# Patient Record
Sex: Female | Born: 1965 | Race: White | Hispanic: No | Marital: Married | Smoking: Never smoker
Health system: Southern US, Community
[De-identification: ages and names within clinical notes are randomized; demographics above are authoritative.]

## PROBLEM LIST (undated history)

## (undated) DIAGNOSIS — E559 Vitamin D deficiency, unspecified: Secondary | ICD-10-CM

## (undated) DIAGNOSIS — E785 Hyperlipidemia, unspecified: Secondary | ICD-10-CM

## (undated) HISTORY — PX: NO PAST SURGERIES: SHX2092

## (undated) HISTORY — DX: Hyperlipidemia, unspecified: E78.5

## (undated) HISTORY — DX: Vitamin D deficiency, unspecified: E55.9

---

## 2017-08-29 DIAGNOSIS — G5762 Lesion of plantar nerve, left lower limb: Secondary | ICD-10-CM | POA: Insufficient documentation

## 2017-08-29 DIAGNOSIS — G5782 Other specified mononeuropathies of left lower limb: Secondary | ICD-10-CM | POA: Insufficient documentation

## 2017-08-29 DIAGNOSIS — M2042 Other hammer toe(s) (acquired), left foot: Secondary | ICD-10-CM | POA: Insufficient documentation

## 2018-08-06 DIAGNOSIS — Z1211 Encounter for screening for malignant neoplasm of colon: Secondary | ICD-10-CM | POA: Insufficient documentation

## 2021-02-21 ENCOUNTER — Encounter: Payer: Self-pay | Admitting: Cardiology

## 2021-03-02 ENCOUNTER — Telehealth: Payer: Self-pay

## 2021-03-02 ENCOUNTER — Encounter: Payer: Self-pay | Admitting: Cardiology

## 2021-03-02 ENCOUNTER — Other Ambulatory Visit: Payer: Self-pay

## 2021-03-02 ENCOUNTER — Ambulatory Visit: Payer: BC Managed Care – PPO | Admitting: Cardiology

## 2021-03-02 ENCOUNTER — Telehealth: Payer: Self-pay | Admitting: Cardiology

## 2021-03-02 VITALS — BP 104/66 | HR 79 | Ht 68.0 in | Wt 146.2 lb

## 2021-03-02 DIAGNOSIS — R0789 Other chest pain: Secondary | ICD-10-CM

## 2021-03-02 DIAGNOSIS — E785 Hyperlipidemia, unspecified: Secondary | ICD-10-CM | POA: Diagnosis not present

## 2021-03-02 DIAGNOSIS — R0602 Shortness of breath: Secondary | ICD-10-CM

## 2021-03-02 MED ORDER — IVABRADINE HCL 5 MG PO TABS: ORAL_TABLET | ORAL | 0 refills | Status: DC

## 2021-03-02 NOTE — Progress Notes (Signed)
Cardiology Office Note:    Date:  03/03/2021   ID:  Nyoka Lint, DOB 28-Oct-1966, MRN [id number]  PCP:  Paulina Fusi, MD  Cardiologist:  Thomasene Ripple, DO  Electrophysiologist:  None   Referring MD: Paulina Fusi, MD   I am having some intermittent chest discomfort  History of Present Illness:    Julia Wise is a 55 y.o. female with a hx of hyperlipidemia, family history of coronary artery disease comes today to be evaluated for chest tightness.  She tells me over the last several months she has had intermittent chest discomfort.  She describes it as a tightness sensation which can be midsternal but sometimes goes to the right side and radiates down her arm.  According to the patient this discomfort can last for few minutes or longer.  Nothing makes it better or worse.  In addition, she used to be very active but recently she is experiencing shortness of breath on exertion.  She tells that she is recently trying to go back to exercising and has not been able to tolerate this.  No palpitations, no lightheadedness, no dizziness.  Past Medical History:  Diagnosis Date  . Hyperlipidemia   . Vitamin D deficiency     Past Surgical History:  Procedure Laterality Date  . NO PAST SURGERIES      Current Medications: Current Meds  Medication Sig  . ivabradine (CORLANOR) 5 MG TABS tablet Take 2 hours before CT scan     Allergies:   Patient has no known allergies.   Social History   Socioeconomic History  . Marital status: Married    Spouse name: Not on file  . Number of children: Not on file  . Years of education: Not on file  . Highest education level: Not on file  Occupational History  . Not on file  Tobacco Use  . Smoking status: Never Smoker  . Smokeless tobacco: Not on file  Substance and Sexual Activity  . Alcohol use: Never  . Drug use: Never  . Sexual activity: Not on file  Other Topics Concern  . Not on file  Social History Narrative  . Not on  file   Social Determinants of Health   Financial Resource Strain: Not on file  Food Insecurity: Not on file  Transportation Needs: Not on file  Physical Activity: Not on file  Stress: Not on file  Social Connections: Not on file     Family History: The patient's family history includes Hyperlipidemia in her mother.  ROS:   Review of Systems  Constitution: Negative for decreased appetite, fever and weight gain.  HENT: Negative for congestion, ear discharge, hoarse voice and sore throat.   Eyes: Negative for discharge, redness, vision loss in right eye and visual halos.  Cardiovascular: Reports chest pain, dyspnea on exertion.  Negative for leg swelling, orthopnea and palpitations.  Respiratory: Negative for cough, hemoptysis, shortness of breath and snoring.   Endocrine: Negative for heat intolerance and polyphagia.  Hematologic/Lymphatic: Negative for bleeding problem. Does not bruise/bleed easily.  Skin: Negative for flushing, nail changes, rash and suspicious lesions.  Musculoskeletal: Negative for arthritis, joint pain, muscle cramps, myalgias, neck pain and stiffness.  Gastrointestinal: Negative for abdominal pain, bowel incontinence, diarrhea and excessive appetite.  Genitourinary: Negative for decreased libido, genital sores and incomplete emptying.  Neurological: Negative for brief paralysis, focal weakness, headaches and loss of balance.  Psychiatric/Behavioral: Negative for altered mental status, depression and suicidal ideas.  Allergic/Immunologic: Negative for  HIV exposure and persistent infections.    EKGs/Labs/Other Studies Reviewed:    The following studies were reviewed today:  EKG:  The ekg ordered today demonstrates sinus rhythm, heart rate 79 bpm with nonspecific ST changes.  Recent Labs: No results found for requested labs within last 8760 hours.  Recent Lipid Panel No results found for: CHOL, TRIG, HDL, CHOLHDL, VLDL, LDLCALC, LDLDIRECT  Physical Exam:     VS:  BP 104/66 (BP Location: Right Arm)   Pulse 79   Ht 5\' 8"  (1.727 m)   Wt 146 lb 3.2 oz (66.3 kg)   SpO2 99%   BMI 22.23 kg/m     Wt Readings from Last 3 Encounters:  03/02/21 146 lb 3.2 oz (66.3 kg)  02/17/21 147 lb (66.7 kg)     GEN: Well nourished, well developed in no acute distress HEENT: Normal NECK: No JVD; No carotid bruits LYMPHATICS: No lymphadenopathy CARDIAC: S1S2 noted,RRR, no murmurs, rubs, gallops RESPIRATORY:  Clear to auscultation without rales, wheezing or rhonchi  ABDOMEN: Soft, non-tender, non-distended, +bowel sounds, no guarding. EXTREMITIES: No edema, No cyanosis, no clubbing MUSCULOSKELETAL:  No deformity  SKIN: Warm and dry NEUROLOGIC:  Alert and oriented x 3, non-focal PSYCHIATRIC:  Normal affect, good insight  ASSESSMENT:    1. SOB (shortness of breath)   2. Other chest pain   3. Hyperlipidemia, unspecified hyperlipidemia type    PLAN:    The symptoms of chest pain is concerning, this patient does have intermediate risk for coronary artery disease and at this time I would like to pursue an ischemic evaluation in this patient.  Shared decision a coronary CTA at this time is appropriate.  I have discussed with the patient about the testing.  The patient has no IV contrast allergy and is agreeable to proceed with this test.  In addition for her shortness of breath, echocardiogram will also be done to assess LV/RV function and any other structure abnormalities.  She is on Crestor 10 mg daily we will continue this for now.  The patient is in agreement with the above plan. The patient left the office in stable condition.  The patient will follow up in   Medication Adjustments/Labs and Tests Ordered: Current medicines are reviewed at length with the patient today.  Concerns regarding medicines are outlined above.  Orders Placed This Encounter  Procedures  . CT CORONARY MORPH W/CTA COR W/SCORE W/CA W/CM &/OR WO/CM  . Basic metabolic panel  .  Magnesium  . EKG 12-Lead  . ECHOCARDIOGRAM COMPLETE   Meds ordered this encounter  Medications  . ivabradine (CORLANOR) 5 MG TABS tablet    Sig: Take 2 hours before CT scan    Dispense:  1 tablet    Refill:  0    Patient Instructions   Medication Instructions:  Your physician recommends that you continue on your current medications as directed. Please refer to the Current Medication list given to you today.  *If you need a refill on your cardiac medications before your next appointment, please call your pharmacy*   Lab Work: Your physician recommends that you return for lab work: 3-7 days before CT: BMET, Mag If you have labs (blood work) drawn today and your tests are completely normal, you will receive your results only by: 02/19/21 MyChart Message (if you have MyChart) OR . A paper copy in the mail If you have any lab test that is abnormal or we need to change your treatment, we will call you to  review the results.   Testing/Procedures: Your physician has requested that you have an echocardiogram. Echocardiography is a painless test that uses sound waves to create images of your heart. It provides your doctor with information about the size and shape of your heart and how well your heart's chambers and valves are working. This procedure takes approximately one hour. There are no restrictions for this procedure.  Your cardiac CT will be scheduled at:   [facility] [address] [place], [place] [address] [phone]  If scheduled at [facility], please arrive at the [facility] main entrance (entrance A) of [facility] 30 minutes prior to test start time. Proceed to the [facility] Radiology Department (first floor) to check-in and test prep.   Please follow these instructions carefully (unless otherwise directed):  On the Night Before the Test: . Be sure to Drink plenty of water. . Do not consume any caffeinated/decaffeinated beverages or  chocolate 12 hours prior to your test. . Do not take any antihistamines 12 hours prior to your test.  On the Day of the Test: . Drink plenty of water until 1 hour prior to the test. . Do not eat any food 4 hours prior to the test. . You may take your regular medications prior to the test.  . Take  Ivabradine (Corlanor) two hours prior to test. . HOLD Furosemide/Hydrochlorothiazide morning of the test. . FEMALES- please wear underwire-free bra if available       After the Test: . Drink plenty of water. . After receiving IV contrast, you may experience a mild flushed feeling. This is normal. . On occasion, you may experience a mild rash up to 24 hours after the test. This is not dangerous. If this occurs, you can take Benadryl 25 mg and increase your fluid intake. . If you experience trouble breathing, this can be serious. If it is severe call 911 IMMEDIATELY. If it is mild, please call our office. . If you take any of these medications: Glipizide/Metformin, Avandament, Glucavance, please do not take 48 hours after completing test unless otherwise instructed.   Once we have confirmed authorization from your insurance company, we will call you to set up a date and time for your test. Based on how quickly your insurance processes prior authorizations requests, please allow up to 4 weeks to be contacted for scheduling your Cardiac CT appointment. Be advised that routine Cardiac CT appointments could be scheduled as many as 8 weeks after your provider has ordered it.  For non-scheduling related questions, please contact the cardiac imaging nurse navigator should you have any questions/concerns: Rockwell Alexandria, Cardiac Imaging Nurse Navigator Larey Brick, Cardiac Imaging Nurse Navigator [facility] Direct Office Dial: [phone]   For scheduling needs, including cancellations and rescheduling, please call [place], [phone].     Follow-Up: At [facility], you and your health needs are our priority.  As part of our continuing mission to provide you with exceptional heart care, we have created designated Provider Care Teams.  These Care Teams include your primary Cardiologist (physician) and Advanced Practice Providers (APPs -  Physician Assistants and Nurse Practitioners) who all work together to provide you with the care you need, when you need it.  We recommend signing up for the patient portal called "MyChart".  Sign up information is provided on this After Visit Summary.  MyChart is used to connect with patients for Virtual Visits (Telemedicine).  Patients are able to  view lab/test results, encounter notes, upcoming appointments, etc.  Non-urgent messages can be sent to your provider as well.   To learn more about what you can do with MyChart, go to [email or web address].    Your next appointment:   As needed  The format for your next appointment:   In Person  Provider:   Thomasene Ripple, DO   Other Instructions  Echocardiogram An echocardiogram is a test that uses sound waves (ultrasound) to produce images of the heart. Images from an echocardiogram can provide important information about:  Heart size and shape.  The size and thickness and movement of your heart's walls.  Heart muscle function and strength.  Heart valve function or if you have stenosis. Stenosis is when the heart valves are too narrow.  If blood is flowing backward through the heart valves (regurgitation).  A tumor or infectious growth around the heart valves.  Areas of heart muscle that are not working well because of poor blood flow or injury from a heart attack.  Aneurysm detection. An aneurysm is a weak or damaged part of an artery wall. The wall bulges out from the normal force of blood pumping through the body. Tell a health care provider about:  Any allergies you have.  All medicines you are taking, including vitamins, herbs, eye drops,  creams, and over-the-counter medicines.  Any blood disorders you have.  Any surgeries you have had.  Any medical conditions you have.  Whether you are pregnant or may be pregnant. What are the risks? Generally, this is a safe test. However, problems may occur, including an allergic reaction to dye (contrast) that may be used during the test. What happens before the test? No specific preparation is needed. You may eat and drink normally. What happens during the test?  You will take off your clothes from the waist up and put on a hospital gown.  Electrodes or electrocardiogram (ECG)patches may be placed on your chest. The electrodes or patches are then connected to a device that monitors your heart rate and rhythm.  You will lie down on a table for an ultrasound exam. A gel will be applied to your chest to help sound waves pass through your skin.  A handheld device, called a transducer, will be pressed against your chest and moved over your heart. The transducer produces sound waves that travel to your heart and bounce back (or "echo" back) to the transducer. These sound waves will be captured in real-time and changed into images of your heart that can be viewed on a video monitor. The images will be recorded on a computer and reviewed by your health care provider.  You may be asked to change positions or hold your breath for a short time. This makes it easier to get different views or better views of your heart.  In some cases, you may receive contrast through an IV in one of your veins. This can improve the quality of the pictures from your heart. The procedure may vary among health care providers and hospitals.   What can I expect after the test? You may return to your normal, everyday life, including diet, activities, and medicines, unless your health care provider tells you not to do that. Follow these instructions at home:  It is up to you to get the results of your test. Ask your  health care provider, or the department that is doing the test, when your results will be ready.  Keep all follow-up visits. This is  important. Summary  An echocardiogram is a test that uses sound waves (ultrasound) to produce images of the heart.  Images from an echocardiogram can provide important information about the size and shape of your heart, heart muscle function, heart valve function, and other possible heart problems.  You do not need to do anything to prepare before this test. You may eat and drink normally.  After the echocardiogram is completed, you may return to your normal, everyday life, unless your health care provider tells you not to do that. This information is not intended to replace advice given to you by your health care provider. Make sure you discuss any questions you have with your health care provider. Document Revised: 06/07/2020 Document Reviewed: 06/07/2020 Elsevier Patient Education  2021 Elsevier Inc.     Adopting a Healthy Lifestyle.  Know what a healthy weight is for you (roughly BMI <25) and aim to maintain this   Aim for 7+ servings of fruits and vegetables daily   65-80+ fluid ounces of water or unsweet tea for healthy kidneys   Limit to max 1 drink of alcohol per day; avoid smoking/tobacco   Limit animal fats in diet for cholesterol and heart health - choose grass fed whenever available   Avoid highly processed foods, and foods high in saturated/trans fats   Aim for low stress - take time to unwind and care for your mental health   Aim for 150 min of moderate intensity exercise weekly for heart health, and weights twice weekly for bone health   Aim for 7-9 hours of sleep daily   When it comes to diets, agreement about the perfect plan isnt easy to find, even among the experts. Experts at the [employer] developed an idea known as the Healthy Eating Plate. Just imagine a plate divided into logical, healthy portions.    The emphasis is on diet quality:   Load up on vegetables and fruits - one-half of your plate: Aim for color and variety, and remember that potatoes dont count.   Go for whole grains - one-quarter of your plate: Whole wheat, barley, wheat berries, quinoa, oats, brown rice, and foods made with them. If you want pasta, go with whole wheat pasta.   Protein power - one-quarter of your plate: Fish, chicken, beans, and nuts are all healthy, versatile protein sources. Limit red meat.   The diet, however, does go beyond the plate, offering a few other suggestions.   Use healthy plant oils, such as olive, canola, soy, corn, sunflower and peanut. Check the labels, and avoid partially hydrogenated oil, which have unhealthy trans fats.   If youre thirsty, drink water. Coffee and tea are good in moderation, but skip sugary drinks and limit milk and dairy products to one or two daily servings.   The type of carbohydrate in the diet is more important than the amount. Some sources of carbohydrates, such as vegetables, fruits, whole grains, and beans-are healthier than others.   Finally, stay active  Signed, Thomasene RippleKardie Ciela Mahajan, DO  03/03/2021 8:00 PM    [facility]

## 2021-03-02 NOTE — Telephone Encounter (Signed)
pharmacy is calling to get clarification on a patient prescription. Please advise

## 2021-03-02 NOTE — Patient Instructions (Addendum)
Medication Instructions:  Your physician recommends that you continue on your current medications as directed. Please refer to the Current Medication list given to you today.  *If you need a refill on your cardiac medications before your next appointment, please call your pharmacy*   Lab Work: Your physician recommends that you return for lab work: 3-7 days before CT: BMET, Mag If you have labs (blood work) drawn today and your tests are completely normal, you will receive your results only by: Marland Kitchen MyChart Message (if you have MyChart) OR . A paper copy in the mail If you have any lab test that is abnormal or we need to change your treatment, we will call you to review the results.   Testing/Procedures: Your physician has requested that you have an echocardiogram. Echocardiography is a painless test that uses sound waves to create images of your heart. It provides your doctor with information about the size and shape of your heart and how well your heart's chambers and valves are working. This procedure takes approximately one hour. There are no restrictions for this procedure.  Your cardiac CT will be scheduled at:   [facility] [address] [place], [place] [address] [phone]  If scheduled at [facility], please arrive at the [facility] main entrance (entrance A) of [facility] 30 minutes prior to test start time. Proceed to the [facility] Radiology Department (first floor) to check-in and test prep.   Please follow these instructions carefully (unless otherwise directed):  On the Night Before the Test: . Be sure to Drink plenty of water. . Do not consume any caffeinated/decaffeinated beverages or chocolate 12 hours prior to your test. . Do not take any antihistamines 12 hours prior to your test.  On the Day of the Test: . Drink plenty of water until 1 hour prior to the test. . Do not eat any food 4 hours prior to the test. . You may take  your regular medications prior to the test.  . Take  Ivabradine (Corlanor) two hours prior to test. . HOLD Furosemide/Hydrochlorothiazide morning of the test. . FEMALES- please wear underwire-free bra if available       After the Test: . Drink plenty of water. . After receiving IV contrast, you may experience a mild flushed feeling. This is normal. . On occasion, you may experience a mild rash up to 24 hours after the test. This is not dangerous. If this occurs, you can take Benadryl 25 mg and increase your fluid intake. . If you experience trouble breathing, this can be serious. If it is severe call 911 IMMEDIATELY. If it is mild, please call our office. . If you take any of these medications: Glipizide/Metformin, Avandament, Glucavance, please do not take 48 hours after completing test unless otherwise instructed.   Once we have confirmed authorization from your insurance company, we will call you to set up a date and time for your test. Based on how quickly your insurance processes prior authorizations requests, please allow up to 4 weeks to be contacted for scheduling your Cardiac CT appointment. Be advised that routine Cardiac CT appointments could be scheduled as many as 8 weeks after your provider has ordered it.  For non-scheduling related questions, please contact the cardiac imaging nurse navigator should you have any questions/concerns: Rockwell Alexandria, Cardiac Imaging Nurse Navigator Larey Brick, Cardiac Imaging Nurse Navigator [facility] Direct Office Dial: [phone]   For scheduling needs, including cancellations  and rescheduling, please call [place], [phone].     Follow-Up: At [facility], you and your health needs are our priority.  As part of our continuing mission to provide you with exceptional heart care, we have created designated Provider Care Teams.  These Care Teams include your primary Cardiologist (physician) and Advanced  Practice Providers (APPs -  Physician Assistants and Nurse Practitioners) who all work together to provide you with the care you need, when you need it.  We recommend signing up for the patient portal called "MyChart".  Sign up information is provided on this After Visit Summary.  MyChart is used to connect with patients for Virtual Visits (Telemedicine).  Patients are able to view lab/test results, encounter notes, upcoming appointments, etc.  Non-urgent messages can be sent to your provider as well.   To learn more about what you can do with MyChart, go to [email or web address].    Your next appointment:   As needed  The format for your next appointment:   In Person  Provider:   Thomasene Ripple, DO   Other Instructions  Echocardiogram An echocardiogram is a test that uses sound waves (ultrasound) to produce images of the heart. Images from an echocardiogram can provide important information about:  Heart size and shape.  The size and thickness and movement of your heart's walls.  Heart muscle function and strength.  Heart valve function or if you have stenosis. Stenosis is when the heart valves are too narrow.  If blood is flowing backward through the heart valves (regurgitation).  A tumor or infectious growth around the heart valves.  Areas of heart muscle that are not working well because of poor blood flow or injury from a heart attack.  Aneurysm detection. An aneurysm is a weak or damaged part of an artery wall. The wall bulges out from the normal force of blood pumping through the body. Tell a health care provider about:  Any allergies you have.  All medicines you are taking, including vitamins, herbs, eye drops, creams, and over-the-counter medicines.  Any blood disorders you have.  Any surgeries you have had.  Any medical conditions you have.  Whether you are pregnant or may be pregnant. What are the risks? Generally, this is a safe test. However, problems may  occur, including an allergic reaction to dye (contrast) that may be used during the test. What happens before the test? No specific preparation is needed. You may eat and drink normally. What happens during the test?  You will take off your clothes from the waist up and put on a hospital gown.  Electrodes or electrocardiogram (ECG)patches may be placed on your chest. The electrodes or patches are then connected to a device that monitors your heart rate and rhythm.  You will lie down on a table for an ultrasound exam. A gel will be applied to your chest to help sound waves pass through your skin.  A handheld device, called a transducer, will be pressed against your chest and moved over your heart. The transducer produces sound waves that travel to your heart and bounce back (or "echo" back) to the transducer. These sound waves will be captured in real-time and changed into images of your heart that can be viewed on a video monitor. The images will be recorded on a computer and reviewed by your health care provider.  You may be asked to change positions or hold your breath for a short time. This makes it easier to get different views or  better views of your heart.  In some cases, you may receive contrast through an IV in one of your veins. This can improve the quality of the pictures from your heart. The procedure may vary among health care providers and hospitals.   What can I expect after the test? You may return to your normal, everyday life, including diet, activities, and medicines, unless your health care provider tells you not to do that. Follow these instructions at home:  It is up to you to get the results of your test. Ask your health care provider, or the department that is doing the test, when your results will be ready.  Keep all follow-up visits. This is important. Summary  An echocardiogram is a test that uses sound waves (ultrasound) to produce images of the heart.  Images  from an echocardiogram can provide important information about the size and shape of your heart, heart muscle function, heart valve function, and other possible heart problems.  You do not need to do anything to prepare before this test. You may eat and drink normally.  After the echocardiogram is completed, you may return to your normal, everyday life, unless your health care provider tells you not to do that. This information is not intended to replace advice given to you by your health care provider. Make sure you discuss any questions you have with your health care provider. Document Revised: 06/07/2020 Document Reviewed: 06/07/2020 Elsevier Patient Education  2021 [employer].

## 2021-03-02 NOTE — Telephone Encounter (Signed)
PA started on [facility] for Corlanor. Key  [id number]

## 2021-03-03 ENCOUNTER — Telehealth: Payer: Self-pay | Admitting: Cardiology

## 2021-03-03 NOTE — Telephone Encounter (Signed)
A script was written for 1 tablet but insurance doesn't cover it and the pt would have to be charged for a bottle of 60. Pharmacy wants to know if there is a alternative med pt can use. ivabradine (CORLANOR) 5 MG TABS tablet

## 2021-03-06 MED ORDER — IVABRADINE HCL 5 MG PO TABS: ORAL_TABLET | ORAL | 0 refills | Status: AC

## 2021-03-06 NOTE — Telephone Encounter (Signed)
Called patient. She would like to pick up from [facility] with discount. Gave her numbers for discount. Sent RX for [facility]radine to [facility] as patient requested.

## 2021-03-06 NOTE — Telephone Encounter (Signed)
Please gave lopressor 50mg  x1 dose

## 2021-03-06 NOTE — Telephone Encounter (Signed)
Could you please give lopressor 50 mg

## 2021-03-06 NOTE — Telephone Encounter (Signed)
Can you please help with this matter? Thanks

## 2021-03-06 NOTE — Telephone Encounter (Signed)
Called and spoke with Julia Wise at [facility]. She states they can not just give one Ivabradine. They come in a bottle of 60 and they can not charge the patient for 60 and just have her throw the other 59 away. She wants to know is there an alternative to the medication or can we give the patient the medication. Will relay to Dr. Servando Salina for recommendation.

## 2021-03-06 NOTE — Telephone Encounter (Signed)
Single tablet of Corlanor would be ~$11 at a [employer] card. May be more preferable for pt than picking up a sample since she does not live in [place]. Would check if she's ok with that cost and picking up a dose at [facility]. If so, pharmacy will need below info to process discounted rate:  BIN [id number] [id number] [id number] Member ID [id number]

## 2021-03-06 NOTE — Telephone Encounter (Signed)
Delice Bison with [facility] is following up.  Are any samples available at [facility]? Patient just needs 1 tablet prior to her CT.  Patient calling the office for samples of medication:   1.  What medication and dosage are you requesting samples for? ivabradine (CORLANOR) 5 MG TABS tablet  2.  Are you currently out of this medication?

## 2021-03-07 ENCOUNTER — Other Ambulatory Visit: Payer: Self-pay

## 2021-03-07 MED ORDER — METOPROLOL TARTRATE 50 MG PO TABS: ORAL_TABLET | ORAL | 0 refills | Status: AC

## 2021-03-07 NOTE — Telephone Encounter (Signed)
Prescription sent to pharmacy.

## 2021-03-07 NOTE — Progress Notes (Signed)
Prescription sent to pharmacy.

## 2021-03-07 NOTE — Telephone Encounter (Signed)
Spoke with patient about making the other medication change. She states the original prescription was sent into [facility] and she was told to use a [facility] card and they would be able to fill the one Ivabradine.

## 2021-03-13 NOTE — Telephone Encounter (Signed)
Denied on CMM. You do not meet the requirements of your plan. Your plan covers this drug when you meet all of these conditions: - You are an adult - You have symptomatic heart failure - You are using it to reduce your risk of being hospitalized for worsening heart failure 

## 2021-03-23 ENCOUNTER — Ambulatory Visit (INDEPENDENT_AMBULATORY_CARE_PROVIDER_SITE_OTHER): Payer: BC Managed Care – PPO

## 2021-03-23 ENCOUNTER — Other Ambulatory Visit: Payer: Self-pay

## 2021-03-23 DIAGNOSIS — R0602 Shortness of breath: Secondary | ICD-10-CM

## 2021-03-23 LAB — ECHOCARDIOGRAM COMPLETE
Area-P 1/2: 3.97 cm2
S' Lateral: 2.3 cm

## 2021-03-23 NOTE — Progress Notes (Signed)
Complete echocardiogram performed.  Jimmy Yasemin Rabon RDCS, RVT  

## 2021-03-29 ENCOUNTER — Telehealth (HOSPITAL_COMMUNITY): Payer: Self-pay | Admitting: *Deleted

## 2021-03-29 NOTE — Telephone Encounter (Signed)
Reaching out to patient to offer assistance regarding upcoming cardiac imaging study; pt verbalizes understanding of appt date/time, parking situation and where to check in, pre-test NPO status and medications ordered, and verified current allergies; name and call back number provided for further questions should they arise  Deon Ivey RN Navigator Cardiac Imaging [facility] [phone] office [phone] cell  Pt to take 50mg metoprolol tartrate 2 hours prior to cardiac CT scan. 

## 2021-03-30 ENCOUNTER — Other Ambulatory Visit: Payer: Self-pay

## 2021-03-30 ENCOUNTER — Ambulatory Visit (HOSPITAL_COMMUNITY)
Admission: RE | Admit: 2021-03-30 | Discharge: 2021-03-30 | Disposition: A | Discharge status: Home / Self Care | Payer: BC Managed Care – PPO | Source: Ambulatory Visit | Attending: Cardiology | Admitting: Cardiology

## 2021-03-30 ENCOUNTER — Other Ambulatory Visit (HOSPITAL_COMMUNITY): Payer: Self-pay | Admitting: Emergency Medicine

## 2021-03-30 DIAGNOSIS — R0789 Other chest pain: Secondary | ICD-10-CM | POA: Insufficient documentation

## 2021-03-30 DIAGNOSIS — R931 Abnormal findings on diagnostic imaging of heart and coronary circulation: Secondary | ICD-10-CM

## 2021-03-30 MED ORDER — SODIUM CHLORIDE 0.9 % IV BOLUS
500.0000 mL | Freq: Once | INTRAVENOUS | Status: AC
  Administered 2021-03-30: 500 mL via INTRAVENOUS

## 2021-03-30 MED ORDER — NITROGLYCERIN 0.4 MG SL SUBL
SUBLINGUAL_TABLET | SUBLINGUAL | Status: AC
  Filled 2021-03-30: qty 1

## 2021-03-30 MED ORDER — IOHEXOL 350 MG/ML SOLN
95.0000 mL | Freq: Once | INTRAVENOUS | Status: AC | PRN
  Administered 2021-03-30: 95 mL via INTRAVENOUS

## 2021-03-30 MED ORDER — NITROGLYCERIN 0.4 MG SL SUBL
0.4000 mg | SUBLINGUAL_TABLET | Freq: Once | SUBLINGUAL | Status: AC
  Administered 2021-03-30: 0.4 mg via SUBLINGUAL

## 2023-01-27 IMAGING — CT CT HEART MORP W/ CTA COR W/ SCORE W/ CA W/CM &/OR W/O CM
1 series · 3 of 3 positions shown, 4 images · IV contrast (omnipaque)
Comparison: None.
COMPARISON: None.

Addendum:
EXAM:
OVER-READ INTERPRETATION  CT CHEST

The following report is an over-read performed by radiologist Dr.
Hj Ahad Shuen [REDACTED] on 03/30/2021. This
over-read does not include interpretation of cardiac or coronary
anatomy or pathology. The coronary calcium score/coronary CTA
interpretation by the cardiologist is attached.
HISTORY: 55 yo female wtih chest pain, nonspecific
Cardiac/Coronary CTA
TECHNIQUE: The patient was scanned on a Siemens Force scanner.
PROTOCOL: A 70 kV prospective scan was triggered in the descending thoracic
aorta at 111 HU's. High pitch axial non-contrast 6 mm slices were
carried out through the heart. The data set was analyzed on a
dedicated work station and scored using the Agatson method. Gantry
rotation speed was 250 msecs and collimation was .6 mm. Beta
blockade and 0.8 mg of sl NTG was given. The 3D data set was
reconstructed in at 87% of the R-R cycle. Diastolic phases were
analyzed on a dedicated work station using MPR, MIP and VRT modes.
The patient received 95mL OMNIPAQUE IOHEXOL 350 MG/ML SOLN of
contrast. Total radiation exposure was 0.7 mSv.

[Series 1129: coronary arteries · 3 of 3 slices shown, 4 images]
[im 1/3  vessel]
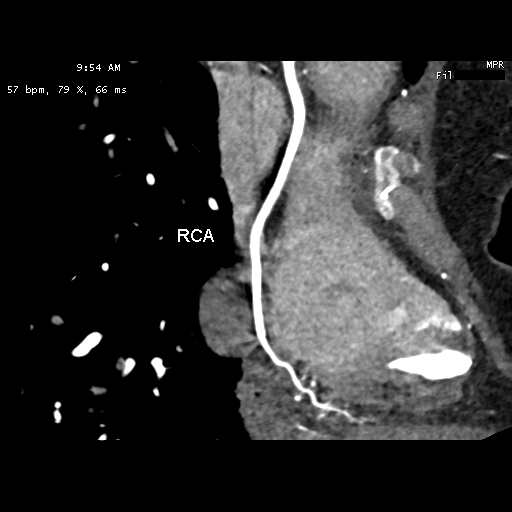
[im 1/3  lung]
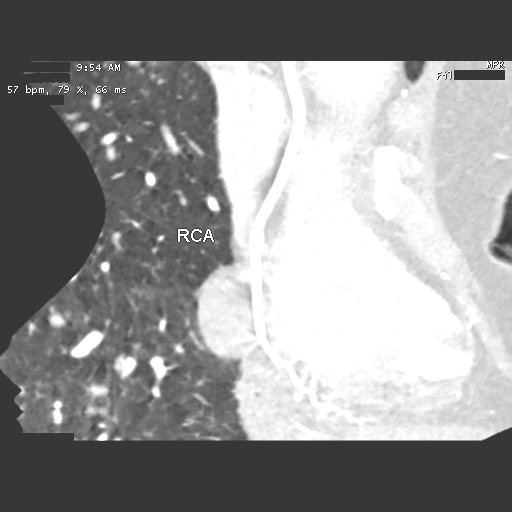
[im 2/3  vessel]
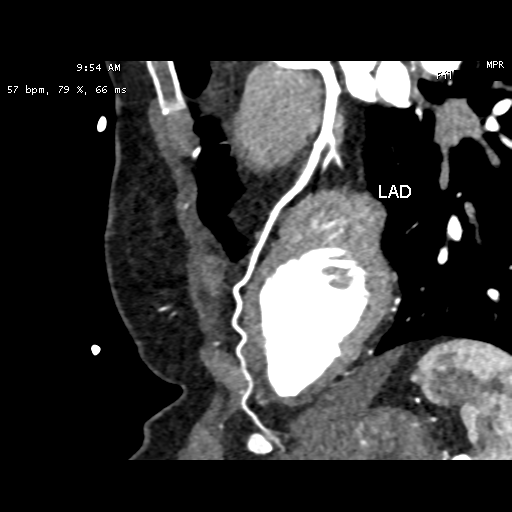
[im 3/3  vessel]
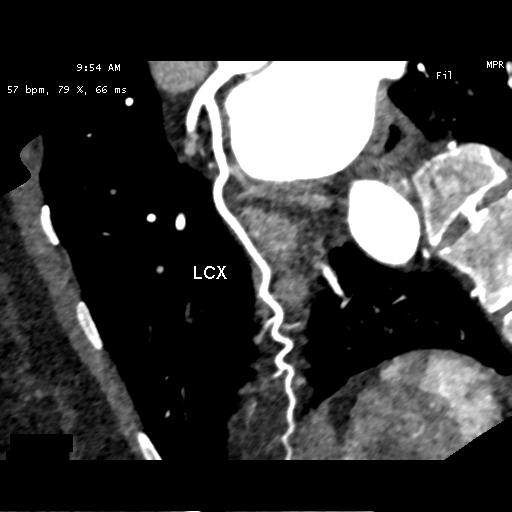

[3 of 3 positions shown; findings below may reference images not displayed]

FINDINGS: There is some clustered micronodularity in the periphery of the
right upper lobe with several tiny 2 mm pulmonary nodules and an
incompletely imaged ground-glass attenuation nodule measuring 9 mm
(axial image 1 of series 9). Within the visualized portions of the
thorax there are no other larger more suspicious appearing pulmonary
nodules or masses, there is no acute consolidative airspace disease,
no pleural effusions, no pneumothorax and no lymphadenopathy.
Visualized portions of the upper abdomen are unremarkable. There are
no aggressive appearing lytic or blastic lesions noted in the
visualized portions of the skeleton.
IMPRESSION: 1. Ground-glass attenuation nodule and clustered micro nodularity in
the periphery of the right upper lobe. Initial follow-up with CT at
6-12 months is recommended to confirm persistence. If persistent,
repeat CT is recommended every 2 years until 5 years of stability
has been established. This recommendation follows the consensus
statement: Guidelines for Management of Incidental Pulmonary Nodules
Detected on CT Images: From the [HOSPITAL] 0377; Radiology
FINDINGS: Quality: Very good, HR 57

Coronary calcium score: The patient's coronary artery calcium score
is 0, which places the patient in the 0 percentile.

Coronary arteries: Normal coronary origins.  Right dominance.

Right Coronary Artery: Normal.  Dominant vessel.

Left Main Coronary Artery: Normal. Bifurcates into the LAD and LCx
arteries.

Left Anterior Descending Coronary Artery: Normal large anterior
vessel that wraps around the apex. Several diagonal branches without
disease.

Left Circumflex Artery: Smaller caliber LCx vessel without disease.
The distal vessel is tortuous.

Aorta: Normal size, 24 mm at the mid ascending aorta (level of the
PA bifurcation) measured double oblique. No calcifications. No
dissection.

Aortic Valve: Trileaflet.  No calcifications.

Other findings:

Normal pulmonary vein drainage into the left atrium.

Normal left atrial appendage without a thrombus.

Normal size of the pulmonary artery.
IMPRESSION: 1. No evidence of CAD, CADRADS = 0.

2. Coronary calcium score of 0. This was 0 percentile for age and
sex matched control.

3. Normal coronary origin with right dominance.

4. Consider non-cardiac causes of chest pain.

*** End of Addendum ***
EXAM:
OVER-READ INTERPRETATION  CT CHEST

The following report is an over-read performed by radiologist Dr.
Hj Ahad Shuen [REDACTED] on 03/30/2021. This
over-read does not include interpretation of cardiac or coronary
anatomy or pathology. The coronary calcium score/coronary CTA
interpretation by the cardiologist is attached.
FINDINGS: There is some clustered micronodularity in the periphery of the
right upper lobe with several tiny 2 mm pulmonary nodules and an
incompletely imaged ground-glass attenuation nodule measuring 9 mm
(axial image 1 of series 9). Within the visualized portions of the
thorax there are no other larger more suspicious appearing pulmonary
nodules or masses, there is no acute consolidative airspace disease,
no pleural effusions, no pneumothorax and no lymphadenopathy.
Visualized portions of the upper abdomen are unremarkable. There are
no aggressive appearing lytic or blastic lesions noted in the
visualized portions of the skeleton.
IMPRESSION: 1. Ground-glass attenuation nodule and clustered micro nodularity in
the periphery of the right upper lobe. Initial follow-up with CT at
6-12 months is recommended to confirm persistence. If persistent,
repeat CT is recommended every 2 years until 5 years of stability
has been established. This recommendation follows the consensus
statement: Guidelines for Management of Incidental Pulmonary Nodules
Detected on CT Images: From the [HOSPITAL] 0377; Radiology
